# Patient Record
Sex: Female | Born: 1995 | Race: White | Hispanic: No | Marital: Single | Smoking: Never smoker
Health system: Southern US, Community
[De-identification: ages and names within clinical notes are randomized; demographics above are authoritative.]

## PROBLEM LIST (undated history)

## (undated) DIAGNOSIS — F32A Depression, unspecified: Secondary | ICD-10-CM

## (undated) DIAGNOSIS — F419 Anxiety disorder, unspecified: Secondary | ICD-10-CM

## (undated) HISTORY — PX: WISDOM TOOTH EXTRACTION: SHX21

---

## 2016-09-04 ENCOUNTER — Encounter (HOSPITAL_COMMUNITY): Payer: Self-pay

## 2016-09-04 DIAGNOSIS — Z79899 Other long term (current) drug therapy: Secondary | ICD-10-CM | POA: Diagnosis not present

## 2016-09-04 DIAGNOSIS — N939 Abnormal uterine and vaginal bleeding, unspecified: Secondary | ICD-10-CM | POA: Diagnosis not present

## 2016-09-04 NOTE — ED Triage Notes (Signed)
Pt states that she has been experiencing vaginal bleeding with clots since Thanksgiving. Endorses intermittent lower abdomen and lower back pain. Denies discharge or urinary symptoms. A&Ox4.

## 2016-09-05 ENCOUNTER — Emergency Department (HOSPITAL_COMMUNITY)
Admission: EM | Admit: 2016-09-05 | Discharge: 2016-09-05 | Disposition: A | Discharge status: Home / Self Care | Payer: BLUE CROSS/BLUE SHIELD | Attending: Emergency Medicine | Admitting: Emergency Medicine

## 2016-09-05 DIAGNOSIS — N939 Abnormal uterine and vaginal bleeding, unspecified: Secondary | ICD-10-CM

## 2016-09-05 LAB — URINALYSIS, ROUTINE W REFLEX MICROSCOPIC
Bilirubin Urine: NEGATIVE
GLUCOSE, UA: NEGATIVE mg/dL
Ketones, ur: NEGATIVE mg/dL
Nitrite: NEGATIVE
PH: 5 (ref 5.0–8.0)
Protein, ur: 100 mg/dL — AB
SPECIFIC GRAVITY, URINE: 1.032 — AB (ref 1.005–1.030)

## 2016-09-05 LAB — CBC
HCT: 29.7 % — ABNORMAL LOW (ref 36.0–46.0)
Hemoglobin: 9.5 g/dL — ABNORMAL LOW (ref 12.0–15.0)
MCH: 25 pg — ABNORMAL LOW (ref 26.0–34.0)
MCHC: 32 g/dL (ref 30.0–36.0)
MCV: 78.2 fL (ref 78.0–100.0)
Platelets: 424 K/uL — ABNORMAL HIGH (ref 150–400)
RBC: 3.8 MIL/uL — ABNORMAL LOW (ref 3.87–5.11)
RDW: 14.6 % (ref 11.5–15.5)
WBC: 10 K/uL (ref 4.0–10.5)

## 2016-09-05 LAB — WET PREP, GENITAL
Clue Cells Wet Prep HPF POC: NONE SEEN
SPERM: NONE SEEN
Trich, Wet Prep: NONE SEEN
YEAST WET PREP: NONE SEEN

## 2016-09-05 LAB — I-STAT BETA HCG BLOOD, ED (MC, WL, AP ONLY): I-stat hCG, quantitative: <5 m[IU]/mL (ref <5)

## 2016-09-05 MED ORDER — NORETHINDRONE ACETATE 5 MG PO TABS: 5.0000 mg | ORAL_TABLET | Freq: Every day | ORAL | 0 refills | Status: AC

## 2016-09-05 NOTE — Discharge Instructions (Signed)
Please take medication daily as directed. This is to help slow down your vaginal bleeding.  You need to have OBGYN follow up! Please call the clinic listed or clinic of your choice to schedule a follow-up appointment first thing Monday morning. Please return to the ER if vaginal bleeding worsens, you become much more weak, new or worsening symptoms occur or you have any additional concerns.

## 2016-09-05 NOTE — ED Provider Notes (Signed)
[facility]-EMERGENCY DEPT Provider Note   CSN: [id number] Arrival date & time: 09/04/16  2221     History   Chief Complaint Chief Complaint  Patient presents with  . Vaginal Bleeding    HPI Holly Castillo is a 21 y.o. female.  The history is provided by the patient and medical records. No language interpreter was used.  Vaginal Bleeding  Primary symptoms include vaginal bleeding.  Primary symptoms include no dysuria. Associated symptoms include abdominal pain. Pertinent negatives include no constipation, no diarrhea, no nausea and no vomiting.   Holly Castillo is an otherwise healthy 21 y.o. female  who presents to the Emergency Department complaining of Persistent vaginal bleeding for the last 2 months. Patient states she will have certain days where the bleeding is lighter and others when the bleeding is heavy. She does endorse intermittent quarter size clots. She has been more tired than usual over the last week, but no dizziness or episodes of syncope. She states that she has had irregular menstrual cycles in the past, but never vaginal bleeding that lasted this long. She is not on birth control and has been off of any oral contraceptive pills for over a year now. She endorses bilateral lower abdominal cramping intermittently. She denies fevers, dysuria, urinary urgency/frequency, vaginal discharge, n/v/ or any other associated complaints.   History reviewed. No pertinent past medical history.  There are no active problems to display for this patient.   History reviewed. No pertinent surgical history.  OB History    No data available       Home Medications    Prior to Admission medications   Medication Sig Start Date End Date Taking? Authorizing Provider  norethindrone (AYGESTIN) 5 MG tablet Take 1 tablet (5 mg total) by mouth daily. 09/05/16   Chase Picket Austin Herd, PA-C    Family History History reviewed. No pertinent family history.  Social History Social History  Substance Use  Topics  . Smoking status: Never Smoker  . Smokeless tobacco: Never Used  . Alcohol use Not on file     Allergies   Patient has no known allergies.   Review of Systems Review of Systems  Constitutional: Negative for chills and fever.  HENT: Negative for congestion.   Eyes: Negative for visual disturbance.  Respiratory: Negative for cough and shortness of breath.   Cardiovascular: Negative.   Gastrointestinal: Positive for abdominal pain. Negative for blood in stool, constipation, diarrhea, nausea and vomiting.  Genitourinary: Positive for vaginal bleeding. Negative for dysuria.  Musculoskeletal: Negative for back pain and neck pain.  Skin: Negative for rash.  Neurological: Negative for headaches.     Physical Exam Updated Vital Signs BP 106/69 (BP Location: Left Arm)   Pulse 82   Temp 98 F (36.7 C) (Oral)   Resp 18   Ht 5' 2.5" (1.588 m)   Wt 108.9 kg   SpO2 100%   BMI 43.20 kg/m   Physical Exam  Constitutional: She is oriented to person, place, and time. She appears well-developed and well-nourished. No distress.  HENT:  Head: Normocephalic and atraumatic.  Cardiovascular: Normal rate, regular rhythm and normal heart sounds.   No murmur heard. Pulmonary/Chest: Effort normal and breath sounds normal. No respiratory distress.  Abdominal: Soft. Bowel sounds are normal. She exhibits no distension. There is tenderness.  Mild bilateral lower abdominal tenderness with no focal areas of tenderness. No rebound or guarding.  Genitourinary:  Genitourinary Comments: Chaperone present for exam. + vaginal bleeding. No vaginal discharge. No adnexal  masses, tenderness, or fullness. No CMT.  Neurological: She is alert and oriented to person, place, and time.  Skin: Skin is warm and dry.  Nursing note and vitals reviewed.    ED Treatments / Results  Labs (all labs ordered are listed, but only abnormal results are displayed) Labs Reviewed  WET PREP, GENITAL - Abnormal;  Notable for the following:       Result Value   WBC, Wet Prep HPF POC MODERATE (*)    All other components within normal limits  CBC - Abnormal; Notable for the following:    RBC 3.80 (*)    Hemoglobin 9.5 (*)    HCT 29.7 (*)    MCH 25.0 (*)    Platelets 424 (*)    All other components within normal limits  URINALYSIS, ROUTINE W REFLEX MICROSCOPIC - Abnormal; Notable for the following:    Color, Urine Elvera (*)    APPearance HAZY (*)    Specific Gravity, Urine 1.032 (*)    Hgb urine dipstick LARGE (*)    Protein, ur 100 (*)    Leukocytes, UA TRACE (*)    Bacteria, UA FEW (*)    Squamous Epithelial / LPF 6-30 (*)    All other components within normal limits  I-STAT BETA HCG BLOOD, ED ([facility], [facility], [facility] ONLY)  GC/CHLAMYDIA PROBE AMP ([place]) NOT AT [facility]    EKG  EKG Interpretation None       Radiology No results found.  Procedures Procedures (including critical care time)  Medications Ordered in ED Medications - No data to display   Initial Impression / Assessment and Plan / ED Course  I have reviewed the triage vital signs and the nursing notes.  Pertinent labs & imaging results that were available during my care of the patient were reviewed by me and considered in my medical decision making (see chart for details).  Clinical Course    [name] is a 21 y.o. female who presents to ED for persistent vaginal bleeding for the last 2 months associated with fatigue. Pregnancy test negative. H&H 9.5/29.7. To exam with bleeding in the vaginal vault, but otherwise reassuring with no cervical motion or adnexal tenderness. Wet prep shows moderate white cells, but otherwise negative. Will treat with short course of oral progestin and outpatient follow-up with OB/GYN. Referral information given. Home care instructions and follow-up care for discussed with the patient who agrees with plan. Return precautions discussed and all questions answered.  Patient discussed with Dr. Jodi MourningZavitz  who agrees with treatment plan.   Final Clinical Impressions(s) / ED Diagnoses   Final diagnoses:  Vaginal bleeding    New Prescriptions Discharge Medication List as of 09/05/2016  5:32 AM    START taking these medications   Details  norethindrone (AYGESTIN) 5 MG tablet Take 1 tablet (5 mg total) by mouth daily., Starting Sat 09/05/2016, Print         Costco WholesaleJaime Pilcher Anastazia Creek, PA-C 09/05/16 [id number]    Blane OharaJoshua Zavitz, MD 09/05/16 [phone]

## 2016-09-07 LAB — GC/CHLAMYDIA PROBE AMP (~~LOC~~) NOT AT ARMC
Chlamydia: POSITIVE — AB
Neisseria Gonorrhea: NEGATIVE

## 2016-09-08 ENCOUNTER — Encounter (HOSPITAL_COMMUNITY): Payer: Self-pay | Admitting: Emergency Medicine

## 2016-09-14 ENCOUNTER — Encounter (HOSPITAL_COMMUNITY): Payer: Self-pay | Admitting: Emergency Medicine

## 2016-09-14 DIAGNOSIS — R102 Pelvic and perineal pain: Secondary | ICD-10-CM | POA: Diagnosis present

## 2016-09-14 DIAGNOSIS — N938 Other specified abnormal uterine and vaginal bleeding: Secondary | ICD-10-CM | POA: Diagnosis not present

## 2016-09-14 DIAGNOSIS — Z79899 Other long term (current) drug therapy: Secondary | ICD-10-CM | POA: Diagnosis not present

## 2016-09-14 DIAGNOSIS — N730 Acute parametritis and pelvic cellulitis: Secondary | ICD-10-CM | POA: Diagnosis not present

## 2016-09-14 NOTE — ED Triage Notes (Signed)
Pt states she received a call from the health dept today telling her she had tested positive for chlamydia and needed to get treatment  Pt states she does not get off work in time to go there so she came here

## 2016-09-15 ENCOUNTER — Emergency Department (HOSPITAL_COMMUNITY)
Admission: EM | Admit: 2016-09-15 | Discharge: 2016-09-15 | Disposition: A | Discharge status: Home / Self Care | Payer: BLUE CROSS/BLUE SHIELD | Attending: Emergency Medicine | Admitting: Emergency Medicine

## 2016-09-15 DIAGNOSIS — N730 Acute parametritis and pelvic cellulitis: Secondary | ICD-10-CM

## 2016-09-15 DIAGNOSIS — N938 Other specified abnormal uterine and vaginal bleeding: Secondary | ICD-10-CM

## 2016-09-15 LAB — I-STAT CHEM 8, ED
BUN: 13 mg/dL (ref 6–20)
Calcium, Ion: 1.25 mmol/L (ref 1.15–1.40)
Chloride: 106 mmol/L (ref 101–111)
Creatinine, Ser: 0.6 mg/dL (ref 0.44–1.00)
Glucose, Bld: 104 mg/dL — ABNORMAL HIGH (ref 65–99)
HEMATOCRIT: 28 % — AB (ref 36.0–46.0)
HEMOGLOBIN: 9.5 g/dL — AB (ref 12.0–15.0)
Potassium: 3.6 mmol/L (ref 3.5–5.1)
SODIUM: 143 mmol/L (ref 135–145)
TCO2: 23 mmol/L (ref 0–100)

## 2016-09-15 MED ORDER — NORETHINDRONE ACETATE 5 MG PO TABS: 5.0000 mg | ORAL_TABLET | Freq: Every day | ORAL | 0 refills | Status: AC

## 2016-09-15 MED ORDER — DOXYCYCLINE HYCLATE 100 MG PO TABS
100.0000 mg | ORAL_TABLET | Freq: Once | ORAL | Status: AC
  Administered 2016-09-15: 100 mg via ORAL
  Filled 2016-09-15: qty 1

## 2016-09-15 MED ORDER — AZITHROMYCIN 250 MG PO TABS: 1000.0000 mg | ORAL_TABLET | Freq: Once | ORAL | Status: DC

## 2016-09-15 MED ORDER — CEFTRIAXONE SODIUM 250 MG IJ SOLR
250.0000 mg | Freq: Once | INTRAMUSCULAR | Status: AC
  Administered 2016-09-15: 250 mg via INTRAMUSCULAR
  Filled 2016-09-15: qty 250

## 2016-09-15 MED ORDER — DOXYCYCLINE HYCLATE 100 MG PO CAPS: 100.0000 mg | ORAL_CAPSULE | Freq: Two times a day (BID) | ORAL | 0 refills | Status: AC

## 2016-09-15 NOTE — ED Notes (Signed)
Patient states that she has been up for 24hrs and was notified by health Dept that she was positive for Chlamydia.  Patient is not showing any signs of distress.

## 2016-09-15 NOTE — ED Notes (Signed)
Patient did not receive the full quantity of her doxycyline-per Dr Christiane HaIssacs-OK to call in an additional 7 days worth of meds[facility] [phone]

## 2016-09-15 NOTE — ED Provider Notes (Signed)
[facility] DEPT Provider Note   CSN: [id number] Arrival date & time: 09/14/16  2111     History   Chief Complaint Chief Complaint  Patient presents with  . SEXUALLY TRANSMITTED DISEASE    HPI Holly Castillo is a 21 y.o. female who presents to be treated for STD and pelvic pain. No significant PMH. She states that she has been having menorrhagia for the past month. She was seen on Jan 13th and had a pelvic exam and was given birth control and advised to follow up with OBGYN. G&C swabs were taken as well and Chlamydia came back positive. The patient was notified recently and she has returned tonight for treatment. She states she has had ongoing lower pelvic pain as well for the past month. It is constant and has worsened since last being seen on the 13th. She denies fever, chills, upper abdominal pain, N/V/D, dysuria, vaginal discharge. She also notes that her bleeding got better with the birth control and when it ran out the bleeding returned. She has not made appt with OBGYN yet.  HPI  History reviewed. No pertinent past medical history.  There are no active problems to display for this patient.   History reviewed. No pertinent surgical history.  OB History    No data available       Home Medications    Prior to Admission medications   Medication Sig Start Date End Date Taking? Authorizing Provider  doxycycline (VIBRAMYCIN) 100 MG capsule Take 1 capsule (100 mg total) by mouth 2 (two) times daily. 09/15/16   Bethel BornKelly Marie Gekas, PA-C  norethindrone (AYGESTIN) 5 MG tablet Take 1 tablet (5 mg total) by mouth daily. 09/05/16   Chase PicketJaime Pilcher Ward, PA-C  norethindrone (AYGESTIN) 5 MG tablet Take 1 tablet (5 mg total) by mouth daily. 09/15/16   Bethel BornKelly Marie Gekas, PA-C    Family History Family History  Problem Relation Age of Onset  . Hypertension Mother   . Epilepsy Mother   . CAD Father   . Heart attack Father     Social History Social History  Substance Use Topics  .  Smoking status: Never Smoker  . Smokeless tobacco: Never Used  . Alcohol use No     Allergies   Patient has no known allergies.   Review of Systems Review of Systems  Constitutional: Negative for chills and fever.  Gastrointestinal: Negative for abdominal pain, diarrhea, nausea and vomiting.  Genitourinary: Positive for menstrual problem, pelvic pain and vaginal bleeding. Negative for dysuria, flank pain and vaginal discharge.  All other systems reviewed and are negative.    Physical Exam Updated Vital Signs BP 124/80 (BP Location: Right Arm)   Pulse 77   Temp 97.9 F (36.6 C) (Oral)   Resp 19   SpO2 100%   Physical Exam  Constitutional: She is oriented to person, place, and time. She appears well-developed and well-nourished. No distress.  HENT:  Head: Normocephalic and atraumatic.  Eyes: Conjunctivae are normal. Pupils are equal, round, and reactive to light. Right eye exhibits no discharge. Left eye exhibits no discharge. No scleral icterus.  Neck: Normal range of motion.  Cardiovascular: Normal rate and regular rhythm.  Exam reveals no gallop and no friction rub.   No murmur heard. Pulmonary/Chest: Effort normal and breath sounds normal. No respiratory distress. She has no wheezes. She has no rales. She exhibits no tenderness.  Abdominal: Soft. Bowel sounds are normal. She exhibits no distension and no mass. There is tenderness. There is  no rebound and no guarding. No hernia.  Bilateral moderate pelvic tenderness in RLQ, suprapubic, and LLQ   Neurological: She is alert and oriented to person, place, and time.  Skin: Skin is warm and dry.  Psychiatric: She has a normal mood and affect. Her behavior is normal.  Nursing note and vitals reviewed.    ED Treatments / Results  Labs (all labs ordered are listed, but only abnormal results are displayed) Labs Reviewed  I-STAT CHEM 8, ED - Abnormal; Notable for the following:       Result Value   Glucose, Bld 104 (*)     Hemoglobin 9.5 (*)    HCT 28.0 (*)    All other components within normal limits    EKG  EKG Interpretation None       Radiology No results found.  Procedures Procedures (including critical care time)  Medications Ordered in ED Medications  cefTRIAXone (ROCEPHIN) injection 250 mg (250 mg Intramuscular Given 09/15/16 0417)  doxycycline (VIBRA-TABS) tablet 100 mg (100 mg Oral Given 09/15/16 0417)     Initial Impression / Assessment and Plan / ED Course  I have reviewed the triage vital signs and the nursing notes.  Pertinent labs & imaging results that were available during my care of the patient were reviewed by me and considered in my medical decision making (see chart for details).  21 year old female presents with positive Chlamydia test and worsening pelvic pain. Patient is afebrile, not tachycardic or tachypneic, normotensive, and not hypoxic. I stat Chem 8 remarkable for anemia which is similar to previous levels. Will tx for PID in the setting of worsening pelvic pain and positive STD test. Rocephin and dose of Doxy given in ED. Doxy rx given as well. Refilled BC pills and advised follow up with OBGYN. Pt verbalized understanding. Patient is NAD, non-toxic, with stable VS. Patient is informed of clinical course, understands medical decision making process, and agrees with plan. Opportunity for questions provided and all questions answered. Return precautions given.   Final Clinical Impressions(s) / ED Diagnoses   Final diagnoses:  PID (acute pelvic inflammatory disease)  DUB (dysfunctional uterine bleeding)    New Prescriptions Discharge Medication List as of 09/15/2016  3:16 PM    START taking these medications   Details  doxycycline (VIBRAMYCIN) 100 MG capsule Take 1 capsule (100 mg total) by mouth 2 (two) times daily., Starting Tue 09/15/2016, Print    !! norethindrone (AYGESTIN) 5 MG tablet Take 1 tablet (5 mg total) by mouth daily., Starting Tue 09/15/2016, Print       !! - Potential duplicate medications found. Please discuss with provider.       Bethel Born, PA-C 09/15/16 2045    Tomasita Crumble, MD 09/17/16 [phone]

## 2016-09-15 NOTE — Discharge Instructions (Signed)
Please take antibiotics for the next two weeks Follow up with OBGYN

## 2016-09-15 NOTE — ED Notes (Signed)
Patient is alert and oriented x3.  She was given DC instructions and follow up visit instructions.  Patient gave verbal understanding. She was DC ambulatory under her own power to home.  V/S stable.  He was not showing any signs of distress on DC 

## 2016-09-17 ENCOUNTER — Emergency Department (HOSPITAL_COMMUNITY)
Admission: EM | Admit: 2016-09-17 | Discharge: 2016-09-17 | Disposition: A | Discharge status: Home / Self Care | Payer: BLUE CROSS/BLUE SHIELD | Attending: Emergency Medicine | Admitting: Emergency Medicine

## 2016-09-17 ENCOUNTER — Encounter (HOSPITAL_COMMUNITY): Payer: Self-pay | Admitting: Emergency Medicine

## 2016-09-17 DIAGNOSIS — R102 Pelvic and perineal pain: Secondary | ICD-10-CM | POA: Diagnosis not present

## 2016-09-17 DIAGNOSIS — Z79899 Other long term (current) drug therapy: Secondary | ICD-10-CM | POA: Insufficient documentation

## 2016-09-17 DIAGNOSIS — R112 Nausea with vomiting, unspecified: Secondary | ICD-10-CM | POA: Insufficient documentation

## 2016-09-17 LAB — CBC
HEMATOCRIT: 32.7 % — AB (ref 36.0–46.0)
Hemoglobin: 10.2 g/dL — ABNORMAL LOW (ref 12.0–15.0)
MCH: 24.3 pg — ABNORMAL LOW (ref 26.0–34.0)
MCHC: 31.2 g/dL (ref 30.0–36.0)
MCV: 78 fL (ref 78.0–100.0)
PLATELETS: 473 10*3/uL — AB (ref 150–400)
RBC: 4.19 MIL/uL (ref 3.87–5.11)
RDW: 15.1 % (ref 11.5–15.5)
WBC: 9 10*3/uL (ref 4.0–10.5)

## 2016-09-17 LAB — COMPREHENSIVE METABOLIC PANEL
ALT: 19 U/L (ref 14–54)
AST: 23 U/L (ref 15–41)
Albumin: 4.8 g/dL (ref 3.5–5.0)
Alkaline Phosphatase: 70 U/L (ref 38–126)
Anion gap: 10 (ref 5–15)
BILIRUBIN TOTAL: 0.4 mg/dL (ref 0.3–1.2)
BUN: 15 mg/dL (ref 6–20)
CO2: 20 mmol/L — ABNORMAL LOW (ref 22–32)
CREATININE: 0.75 mg/dL (ref 0.44–1.00)
Calcium: 9.3 mg/dL (ref 8.9–10.3)
Chloride: 108 mmol/L (ref 101–111)
Glucose, Bld: 101 mg/dL — ABNORMAL HIGH (ref 65–99)
POTASSIUM: 3.4 mmol/L — AB (ref 3.5–5.1)
Sodium: 138 mmol/L (ref 135–145)
TOTAL PROTEIN: 8.4 g/dL — AB (ref 6.5–8.1)

## 2016-09-17 LAB — URINALYSIS, ROUTINE W REFLEX MICROSCOPIC
BILIRUBIN URINE: NEGATIVE
Glucose, UA: NEGATIVE mg/dL
KETONES UR: NEGATIVE mg/dL
LEUKOCYTES UA: NEGATIVE
NITRITE: NEGATIVE
PH: 5 (ref 5.0–8.0)
Protein, ur: NEGATIVE mg/dL
SPECIFIC GRAVITY, URINE: 1.029 (ref 1.005–1.030)

## 2016-09-17 LAB — LIPASE, BLOOD: LIPASE: 20 U/L (ref 11–51)

## 2016-09-17 MED ORDER — NAPROXEN 500 MG PO TABS: 500.0000 mg | ORAL_TABLET | Freq: Two times a day (BID) | ORAL | 0 refills | Status: AC | PRN

## 2016-09-17 MED ORDER — AZITHROMYCIN 1 G PO PACK: 1.0000 g | PACK | Freq: Once | ORAL | Status: DC

## 2016-09-17 MED ORDER — MECLIZINE HCL 25 MG PO TABS: 25.0000 mg | ORAL_TABLET | Freq: Three times a day (TID) | ORAL | 0 refills | Status: AC | PRN

## 2016-09-17 MED ORDER — NAPROXEN 500 MG PO TABS
500.0000 mg | ORAL_TABLET | Freq: Once | ORAL | Status: AC
  Administered 2016-09-17: 500 mg via ORAL
  Filled 2016-09-17: qty 1

## 2016-09-17 MED ORDER — FLORANEX PO PACK: 1.0000 g | PACK | Freq: Two times a day (BID) | ORAL | 0 refills | Status: AC

## 2016-09-17 MED ORDER — ONDANSETRON HCL 4 MG PO TABS
4.0000 mg | ORAL_TABLET | Freq: Once | ORAL | Status: AC
  Administered 2016-09-17: 4 mg via ORAL
  Filled 2016-09-17: qty 1

## 2016-09-17 MED ORDER — METRONIDAZOLE 500 MG PO TABS: 500.0000 mg | ORAL_TABLET | Freq: Two times a day (BID) | ORAL | 0 refills | Status: AC

## 2016-09-17 MED ORDER — AZITHROMYCIN 250 MG PO TABS
1000.0000 mg | ORAL_TABLET | Freq: Once | ORAL | Status: AC
  Administered 2016-09-17: 1000 mg via ORAL
  Filled 2016-09-17: qty 4

## 2016-09-17 NOTE — ED Triage Notes (Addendum)
Pt reports she began to have emesis this am. Also had bilateral lower abd pain last night. Small amount of diarrhea this am. Is on abx at home for PID. Was told to return if she began to have emesis

## 2016-09-17 NOTE — Discharge Instructions (Signed)
°  Take your antibiotics as directed and to completion. You should never have any leftover antibiotics! Push fluids and stay well hydrated.   Any antibiotic use can reduce the efficacy of hormonal birth control. Please use back up method of contraception.   Do NOT drink alcohol with these medications.

## 2016-09-17 NOTE — ED Notes (Signed)
Patient d/c'd self care.  F/U and medications reviewed.  Patient verbalized understanding. 

## 2016-09-17 NOTE — ED Provider Notes (Signed)
[facility] DEPT Provider Note   CSN: [id number] Arrival date & time: 09/17/16  [id number]     History   Chief Complaint Chief Complaint  Patient presents with  . Emesis    HPI Holly Castillo is a 21 y.o. female who presents with cc of abdominal pain and vomiting. Patient was seen on 09/05/2016 for vaginal bleeding. She had a workup done at that time is without tenderness, but had a positive chlamydia test. Patient was then seen 2 days ago and given her worsening pelvic pain. The patient was treated empirically for PID with doxycycline and a dose of Rocephin at her last visit. Patient states that she has had nausea since that time and had one episode of vomiting this morning. She did vomit her medications. The patient states that by the end of the day. She has 8-10 out of 10 pelvic pain that is relieved with 2 Tylenol. 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 She states that her bleeding has improved since that time. She complains that the pain is bilateral. She has had some loose stool without diarrhea. She denies fevers.  HPI  History reviewed. No pertinent past medical history.  There are no active problems to display for this patient.   History reviewed. No pertinent surgical history.  OB History    No data available       Home Medications    Prior to Admission medications   Medication Sig Start Date End Date Taking? Authorizing Provider  doxycycline (VIBRAMYCIN) 100 MG capsule Take 1 capsule (100 mg total) by mouth 2 (two) times daily. 09/15/16   Bethel BornKelly Marie Gekas, PA-C  norethindrone (AYGESTIN) 5 MG tablet Take 1 tablet (5 mg total) by mouth daily. 09/05/16   Chase PicketJaime Pilcher Ward, PA-C  norethindrone (AYGESTIN) 5 MG tablet Take 1 tablet (5 mg total) by mouth daily. 09/15/16   Bethel BornKelly Marie Gekas, PA-C    Family History Family History  Problem Relation Age of Onset  . Hypertension Mother   . Epilepsy Mother    . CAD Father   . Heart attack Father     Social History Social History  Substance Use Topics  . Smoking status: Never Smoker  . Smokeless tobacco: Never Used  . Alcohol use No     Allergies   Patient has no known allergies.   Review of Systems Review of Systems Ten systems reviewed and are negative for acute change, except as noted in the HPI.   Physical Exam Updated Vital Signs BP 134/68 (BP Location: Right Arm)   Pulse 88   Temp 98.6 F (37 C) (Oral)   Resp 20   SpO2 100%   Physical Exam  Constitutional: She is oriented to person, place, and time. She appears well-developed and well-nourished. No distress.  HENT:  Head: Normocephalic and atraumatic.  Eyes: Conjunctivae are normal. No scleral icterus.  Neck: Normal range of motion.  Cardiovascular: Normal rate, regular rhythm and normal heart sounds.  Exam reveals no gallop and no friction rub.   No murmur heard. Pulmonary/Chest: Effort normal and breath sounds normal. No respiratory distress.  Abdominal: Soft. Bowel sounds are normal. She exhibits no distension and no mass. There is tenderness. There is no guarding.  BL TTP pelvis and suprapubic region   Neurological: She is alert and oriented to person, place, and time.  Skin: Skin is warm and dry. She is not diaphoretic.  Nursing note and vitals reviewed.    ED Treatments / Results  Labs (all labs ordered are listed, but only abnormal results are displayed) Labs Reviewed  COMPREHENSIVE METABOLIC PANEL - Abnormal; Notable for the following:       Result Value   Potassium 3.4 (*)    CO2 20 (*)    Glucose, Bld 101 (*)    Total Protein 8.4 (*)    All other components within normal limits  CBC - Abnormal; Notable for the following:    Hemoglobin 10.2 (*)    HCT 32.7 (*)    MCH 24.3 (*)    Platelets 473 (*)    All other components within normal limits  LIPASE, BLOOD  URINALYSIS, ROUTINE W REFLEX MICROSCOPIC    EKG  EKG Interpretation None         Radiology No results found.  Procedures Procedures (including critical care time)  Medications Ordered in ED Medications - No data to display   Initial Impression / Assessment and Plan / ED Course  I have reviewed the triage vital signs and the nursing notes.  Pertinent labs & imaging results that were available during my care of the patient were reviewed by me and considered in my medical decision making (see chart for details).       patient with PID,  Bl pelvic pain does not favor TOA.  Patient pain markedly improved with naproxen  she will be discharged with meds listed below. Patient seen in shared visit with attending physician. Who agrees with assessment, work up , treatment, and plan for discharge.   Final Clinical Impressions(s) / ED Diagnoses   Final diagnoses:  Nausea and vomiting, intractability of vomiting not specified, unspecified vomiting type  Pelvic pain    New Prescriptions Discharge Medication List as of 09/17/2016  4:41 PM    START taking these medications   Details  lactobacillus (FLORANEX/LACTINEX) PACK Take 1 packet (1 g total) by mouth 2 (two) times daily., Starting Thu 09/17/2016, Print    meclizine (ANTIVERT) 25 MG tablet Take 1 tablet (25 mg total) by mouth 3 (three) times daily as needed for nausea., Starting Thu 09/17/2016, Print    metroNIDAZOLE (FLAGYL) 500 MG tablet Take 1 tablet (500 mg total) by mouth 2 (two) times daily., Starting Thu 09/17/2016, Print    naproxen (NAPROSYN) 500 MG tablet Take 1 tablet (500 mg total) by mouth 2 (two) times daily as needed for moderate pain., Starting Thu 09/17/2016, Print         Arthor Captain, PA-C 09/17/16 2051    Arby Barrette, MD 09/21/16 [phone]

## 2016-09-17 NOTE — ED Notes (Addendum)
Pt reports vomiting about 30 mins after taking doxycycline this am.

## 2020-11-04 ENCOUNTER — Emergency Department (HOSPITAL_COMMUNITY)
Admission: EM | Admit: 2020-11-04 | Discharge: 2020-11-04 | Disposition: A | Discharge status: Home / Self Care | Payer: Self-pay | Attending: Emergency Medicine | Admitting: Emergency Medicine

## 2020-11-04 ENCOUNTER — Emergency Department (HOSPITAL_COMMUNITY): Payer: Self-pay

## 2020-11-04 ENCOUNTER — Encounter (HOSPITAL_COMMUNITY): Payer: Self-pay

## 2020-11-04 ENCOUNTER — Other Ambulatory Visit: Payer: Self-pay

## 2020-11-04 DIAGNOSIS — R197 Diarrhea, unspecified: Secondary | ICD-10-CM | POA: Insufficient documentation

## 2020-11-04 DIAGNOSIS — R112 Nausea with vomiting, unspecified: Secondary | ICD-10-CM | POA: Insufficient documentation

## 2020-11-04 DIAGNOSIS — R1010 Upper abdominal pain, unspecified: Secondary | ICD-10-CM | POA: Insufficient documentation

## 2020-11-04 HISTORY — DX: Anxiety disorder, unspecified: F41.9

## 2020-11-04 HISTORY — DX: Depression, unspecified: F32.A

## 2020-11-04 LAB — COMPREHENSIVE METABOLIC PANEL
ALT: 16 U/L (ref 0–44)
AST: 15 U/L (ref 15–41)
Albumin: 4.4 g/dL (ref 3.5–5.0)
Alkaline Phosphatase: 41 U/L (ref 38–126)
Anion gap: 10 (ref 5–15)
BUN: 11 mg/dL (ref 6–20)
CO2: 22 mmol/L (ref 22–32)
Calcium: 9.3 mg/dL (ref 8.9–10.3)
Chloride: 106 mmol/L (ref 98–111)
Creatinine, Ser: 0.78 mg/dL (ref 0.44–1.00)
GFR, Estimated: >60 mL/min (ref >60)
Glucose, Bld: 111 mg/dL — ABNORMAL HIGH (ref 70–99)
Potassium: 3.2 mmol/L — ABNORMAL LOW (ref 3.5–5.1)
Sodium: 138 mmol/L (ref 135–145)
Total Bilirubin: 0.5 mg/dL (ref 0.3–1.2)
Total Protein: 7.7 g/dL (ref 6.5–8.1)

## 2020-11-04 LAB — CBC WITH DIFFERENTIAL/PLATELET
Abs Immature Granulocytes: 0.06 10*3/uL (ref 0.00–0.07)
Basophils Absolute: 0.1 10*3/uL (ref 0.0–0.1)
Basophils Relative: 1 %
Eosinophils Absolute: 0 10*3/uL (ref 0.0–0.5)
Eosinophils Relative: 0 %
HCT: 42.9 % (ref 36.0–46.0)
Hemoglobin: 15.1 g/dL — ABNORMAL HIGH (ref 12.0–15.0)
Immature Granulocytes: 1 %
Lymphocytes Relative: 23 %
Lymphs Abs: 1.9 10*3/uL (ref 0.7–4.0)
MCH: 32.8 pg (ref 26.0–34.0)
MCHC: 35.2 g/dL (ref 30.0–36.0)
MCV: 93.1 fL (ref 80.0–100.0)
Monocytes Absolute: 0.6 10*3/uL (ref 0.1–1.0)
Monocytes Relative: 7 %
Neutro Abs: 5.5 10*3/uL (ref 1.7–7.7)
Neutrophils Relative %: 68 %
Platelets: 241 10*3/uL (ref 150–400)
RBC: 4.61 MIL/uL (ref 3.87–5.11)
RDW: 13.1 % (ref 11.5–15.5)
WBC: 8.2 10*3/uL (ref 4.0–10.5)
nRBC: 0 % (ref 0.0–0.2)

## 2020-11-04 LAB — URINALYSIS, ROUTINE W REFLEX MICROSCOPIC
Bilirubin Urine: NEGATIVE
Glucose, UA: NEGATIVE mg/dL
Hgb urine dipstick: NEGATIVE
Ketones, ur: NEGATIVE mg/dL
Leukocytes,Ua: NEGATIVE
Nitrite: NEGATIVE
Protein, ur: NEGATIVE mg/dL
Specific Gravity, Urine: 1.024 (ref 1.005–1.030)
pH: 5 (ref 5.0–8.0)

## 2020-11-04 LAB — RAPID URINE DRUG SCREEN, HOSP PERFORMED
Amphetamines: NOT DETECTED
Barbiturates: NOT DETECTED
Benzodiazepines: NOT DETECTED
Cocaine: NOT DETECTED
Opiates: POSITIVE — AB
Tetrahydrocannabinol: POSITIVE — AB

## 2020-11-04 LAB — LIPASE, BLOOD: Lipase: 25 U/L (ref 11–51)

## 2020-11-04 LAB — PREGNANCY, URINE: Preg Test, Ur: NEGATIVE

## 2020-11-04 MED ORDER — ONDANSETRON 4 MG PO TBDP: 4.0000 mg | ORAL_TABLET | Freq: Three times a day (TID) | ORAL | 2 refills | Status: AC | PRN

## 2020-11-04 MED ORDER — SODIUM CHLORIDE 0.9 % IV SOLN: INTRAVENOUS | Status: DC

## 2020-11-04 MED ORDER — IOHEXOL 300 MG/ML  SOLN
100.0000 mL | Freq: Once | INTRAMUSCULAR | Status: AC | PRN
  Administered 2020-11-04: 100 mL via INTRAVENOUS

## 2020-11-04 MED ORDER — SODIUM CHLORIDE 0.9 % IV BOLUS
1000.0000 mL | Freq: Once | INTRAVENOUS | Status: AC
  Administered 2020-11-04: 1000 mL via INTRAVENOUS

## 2020-11-04 MED ORDER — ONDANSETRON HCL 4 MG/2ML IJ SOLN
4.0000 mg | Freq: Once | INTRAMUSCULAR | Status: AC
  Administered 2020-11-04: 4 mg via INTRAVENOUS
  Filled 2020-11-04: qty 2

## 2020-11-04 NOTE — ED Triage Notes (Signed)
Pt presents to ED with complaints of emesis and diarrhea x couple of weeks. Pt seen at [facility] x 2 and was diagnosed with stomach bug couple weeks ago but states hasn't gotten any better.

## 2020-11-04 NOTE — Discharge Instructions (Signed)
Take the Zofran as directed new prescription provided.  Call and make an appointment to follow-up with GI medicine.  Today's labs and CAT scan without any significant findings.

## 2020-11-04 NOTE — ED Provider Notes (Signed)
[facility] EMERGENCY DEPARTMENT Provider Note   CSN: [id number] Arrival date & time: 11/04/20  2482     History Chief Complaint  Patient presents with  . Emesis    Holly Castillo is a 25 y.o. female.  Patient presenting with a complaint of nausea vomiting and diarrhea this been ongoing for about 3 weeks.  Patient seen at [facility] on February 21 and February 23 and felt to be a gastroenteritis at that time.  Did have a negative CT test and labs without significant abnormalities.  Covid testing was negative as well.  Patient states that continues to have vomiting about 5 times a day.  No blood in it.  And diarrhea no blood in that.  No fever here temp 98.1.  Heart rate 83 blood pressure 107/66.  Not associated with abdominal pain        Past Medical History:  Diagnosis Date  . Anxiety   . Depression     There are no problems to display for this patient.   Past Surgical History:  Procedure Laterality Date  . WISDOM TOOTH EXTRACTION       OB History   No obstetric history on file.     Family History  Problem Relation Age of Onset  . Hypertension Mother   . Epilepsy Mother   . CAD Father   . Heart attack Father     Social History   Tobacco Use  . Smoking status: Never Smoker  . Smokeless tobacco: Never Used  Vaping Use  . Vaping Use: Former  Substance Use Topics  . Alcohol use: No  . Drug use: Yes    Types: Marijuana    Home Medications Prior to Admission medications   Medication Sig Start Date End Date Taking? Authorizing Provider  bismuth subsalicylate (PEPTO BISMOL) 262 MG/15ML suspension Take 30 mLs by mouth every 6 (six) hours as needed for indigestion or diarrhea or loose stools.   Yes [provider]  Famotidine (PEPCID PO) Take 1 tablet by mouth daily as needed (indigestion/reflux).   Yes [provider]  ondansetron (ZOFRAN ODT) 4 MG disintegrating tablet Take 1 tablet (4 mg total) by mouth every 8 (eight) hours as  needed. 11/04/20  Yes Vanetta Mulders, MD  doxycycline (VIBRAMYCIN) 100 MG capsule Take 1 capsule (100 mg total) by mouth 2 (two) times daily. Patient not taking: Reported on 11/04/2020 09/15/16   Bethel Born, PA-C  lactobacillus (FLORANEX/LACTINEX) PACK Take 1 packet (1 g total) by mouth 2 (two) times daily. Patient not taking: No sig reported 09/17/16   Arthor Captain, PA-C  meclizine (ANTIVERT) 25 MG tablet Take 1 tablet (25 mg total) by mouth 3 (three) times daily as needed for nausea. Patient not taking: No sig reported 09/17/16   Arthor Captain, PA-C  metroNIDAZOLE (FLAGYL) 500 MG tablet Take 1 tablet (500 mg total) by mouth 2 (two) times daily. Patient not taking: No sig reported 09/17/16   Arthor Captain, PA-C  naproxen (NAPROSYN) 500 MG tablet Take 1 tablet (500 mg total) by mouth 2 (two) times daily as needed for moderate pain. Patient not taking: No sig reported 09/17/16   Arthor Captain, PA-C  norethindrone (AYGESTIN) 5 MG tablet Take 1 tablet (5 mg total) by mouth daily. Patient not taking: No sig reported 09/05/16   Ward, Chase Picket, PA-C  norethindrone (AYGESTIN) 5 MG tablet Take 1 tablet (5 mg total) by mouth daily. Patient not taking: Reported on 11/04/2020 09/15/16   Bethel Born,  PA-C    Allergies    Patient has no known allergies.  Review of Systems   Review of Systems  Constitutional: Negative for chills and fever.  HENT: Negative for rhinorrhea and sore throat.   Eyes: Negative for visual disturbance.  Respiratory: Negative for cough and shortness of breath.   Cardiovascular: Negative for chest pain and leg swelling.  Gastrointestinal: Positive for diarrhea, nausea and vomiting. Negative for abdominal pain.  Genitourinary: Negative for dysuria.  Musculoskeletal: Negative for back pain and neck pain.  Skin: Negative for rash.  Neurological: Negative for dizziness, light-headedness and headaches.  Hematological: Does not bruise/bleed easily.   Psychiatric/Behavioral: Negative for confusion.    Physical Exam Updated Vital Signs BP 111/80   Pulse (!) 52   Temp 98.1 F (36.7 C) (Oral)   Resp 18   Ht 1.575 m (5\' 2" )   Wt 96.2 kg   LMP 09/06/2020   SpO2 100%   BMI 38.78 kg/m   Physical Exam Vitals and nursing note reviewed.  Constitutional:      General: She is not in acute distress.    Appearance: Normal appearance. She is well-developed.  HENT:     Head: Normocephalic and atraumatic.  Eyes:     Extraocular Movements: Extraocular movements intact.     Conjunctiva/sclera: Conjunctivae normal.     Pupils: Pupils are equal, round, and reactive to light.  Cardiovascular:     Rate and Rhythm: Normal rate and regular rhythm.     Heart sounds: No murmur heard.   Pulmonary:     Effort: Pulmonary effort is normal. No respiratory distress.     Breath sounds: Normal breath sounds.  Abdominal:     Palpations: Abdomen is soft.     Tenderness: There is no abdominal tenderness.  Musculoskeletal:        General: Normal range of motion.     Cervical back: Normal range of motion and neck supple.  Skin:    General: Skin is warm and dry.  Neurological:     General: No focal deficit present.     Mental Status: She is alert and oriented to person, place, and time.     Cranial Nerves: No cranial nerve deficit.     Sensory: No sensory deficit.     Motor: No weakness.     ED Results / Procedures / Treatments   Labs (all labs ordered are listed, but only abnormal results are displayed) Labs Reviewed  CBC WITH DIFFERENTIAL/PLATELET - Abnormal; Notable for the following components:      Result Value   Hemoglobin 15.1 (*)    All other components within normal limits  COMPREHENSIVE METABOLIC PANEL - Abnormal; Notable for the following components:   Potassium 3.2 (*)    Glucose, Bld 111 (*)    All other components within normal limits  URINALYSIS, ROUTINE W REFLEX MICROSCOPIC - Abnormal; Notable for the following  components:   APPearance CLOUDY (*)    All other components within normal limits  RAPID URINE DRUG SCREEN, HOSP PERFORMED - Abnormal; Notable for the following components:   Opiates POSITIVE (*)    Tetrahydrocannabinol POSITIVE (*)    All other components within normal limits  LIPASE, BLOOD  PREGNANCY, URINE    EKG None  Radiology CT Abdomen Pelvis W Contrast  Result Date: 11/04/2020 CLINICAL DATA:  Nausea and vomiting, diarrhea. Upper abdominal and upper back pain. EXAM: CT ABDOMEN AND PELVIS WITH CONTRAST TECHNIQUE: Multidetector CT imaging of the abdomen and pelvis was  performed using the standard protocol following bolus administration of intravenous contrast. CONTRAST:  OMNIPAQUE IOHEXOL 300 MG/ML  SOLN COMPARISON:  10/14/2020. FINDINGS: Lower chest: Lung bases are clear. Heart is at the upper limits of normal in size. No pericardial or pleural effusion. Distal esophagus is unremarkable. Hepatobiliary: Liver and gallbladder are unremarkable. No biliary ductal dilatation. Pancreas: Negative. Spleen: Negative. Adrenals/Urinary Tract: Adrenal glands and kidneys are unremarkable. Ureters are decompressed. Bladder is grossly unremarkable. Stomach/Bowel: Stomach, small bowel, appendix and colon are unremarkable. Vascular/Lymphatic: Vascular structures are unremarkable. No pathologically enlarged lymph nodes. Reproductive: Uterus is visualized.  No adnexal mass. Other: No free fluid.  Mesenteries and peritoneum are unremarkable. Musculoskeletal: None. IMPRESSION: Normal exam.  No findings to explain the patient's clinical history. Electronically Signed   By: Leanna Battles M.D.   On: 11/04/2020 09:45    Procedures Procedures   Medications Ordered in ED Medications  0.9 %  sodium chloride infusion ( Intravenous New Bag/Given 11/04/20 0937)  sodium chloride 0.9 % bolus 1,000 mL (0 mLs Intravenous Stopped 11/04/20 0937)  ondansetron (ZOFRAN) injection 4 mg (4 mg Intravenous Given 11/04/20  0739)  iohexol (OMNIPAQUE) 300 MG/ML solution 100 mL (100 mLs Intravenous Contrast Given 11/04/20 0919)    ED Course  I have reviewed the triage vital signs and the nursing notes.  Pertinent labs & imaging results that were available during my care of the patient were reviewed by me and considered in my medical decision making (see chart for details).    MDM Rules/Calculators/A&P                         Work-up here repeat labs normal white blood cell count.  No significant anemia.  Complete metabolic panel showed mild hypokalemia at 3.2.  But no significant liver function test abnormalities.  Lipase was normal at 25.  Urinalysis it was cloudy but no signs of urinary tract infection.  Urine drug screen was positive for opiates and THC.  Possible that these could be playing a role.  But patient does not appear to be in any acute distress here certainly and showing no signs of withdrawal from narcotics.  And pregnancy test was negative.  Repeat CT scan which patient wanted to do had no acute findings.  Nothing to explain patient's symptoms.  Will refer patient to GI medicine for further evaluation.  Patient states that Zofran in the past has been helpful.  And she is out of that so we will provide a new prescription for that.  Final Clinical Impression(s) / ED Diagnoses Final diagnoses:  Nausea vomiting and diarrhea    Rx / DC Orders ED Discharge Orders         Ordered    ondansetron (ZOFRAN ODT) 4 MG disintegrating tablet  Every 8 hours PRN        11/04/20 1026           Vanetta Mulders, MD 11/04/20 1030

## 2022-03-10 IMAGING — CT CT ABD-PELV W/ CM
2 of 3 series · 17 of 46 positions shown, 19 images · IV contrast (omnipaque)
Comparison: 10/14/2020.

CLINICAL DATA: Nausea and vomiting, diarrhea. Upper abdominal and
upper back pain.

EXAM:
CT ABDOMEN AND PELVIS WITH CONTRAST
TECHNIQUE: Multidetector CT imaging of the abdomen and pelvis was performed
using the standard protocol following bolus administration of
intravenous contrast.
CONTRAST:  100mL OMNIPAQUE IOHEXOL 300 MG/ML  SOLN

[Series 2: axial st · axial · 0.71mm/px · z∈[+735,+1140]mm · 14 of 94 slices shown, 16 images]
[im 7/94  soft-tissue]
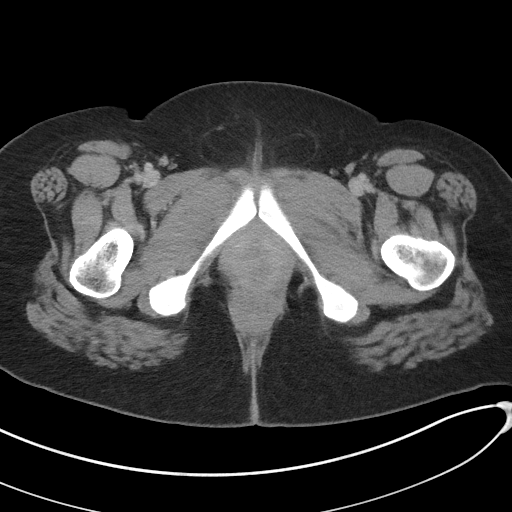
[im 7/94  bone]
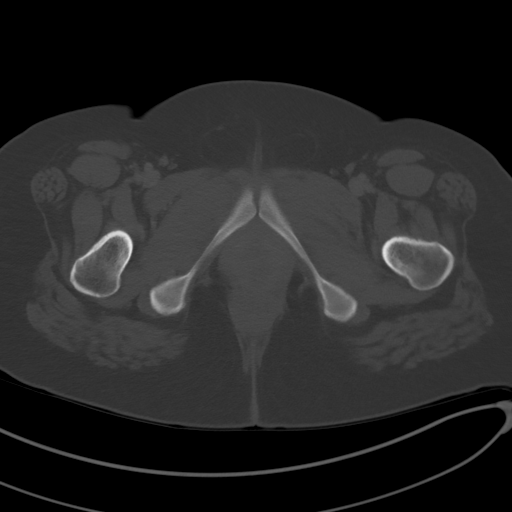
[im 13/94  soft-tissue]
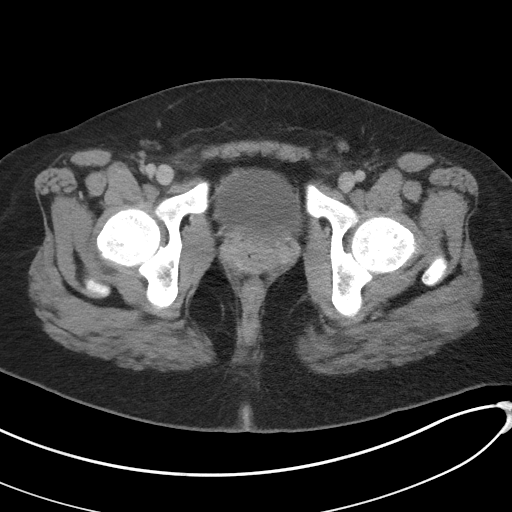
[im 19/94  soft-tissue]
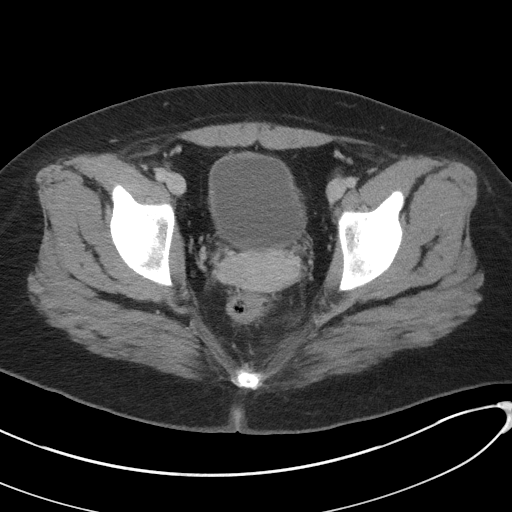
[im 25/94  soft-tissue]
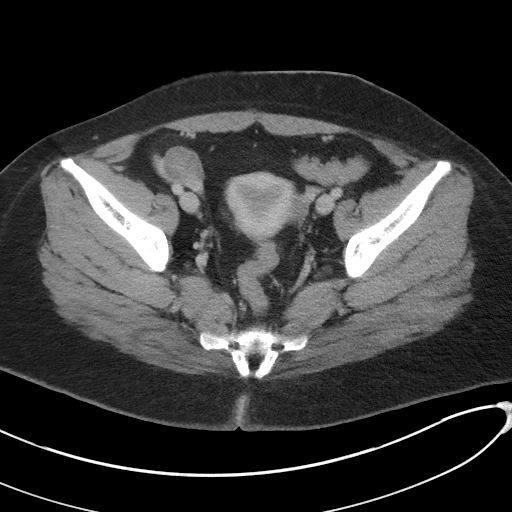
[im 31/94  soft-tissue]
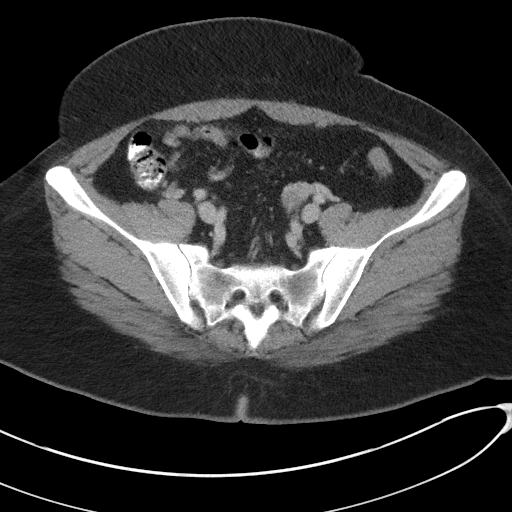
[im 37/94  soft-tissue]
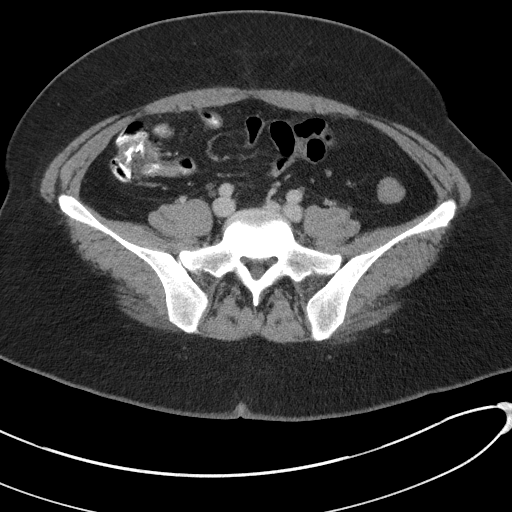
[im 43/94  soft-tissue]
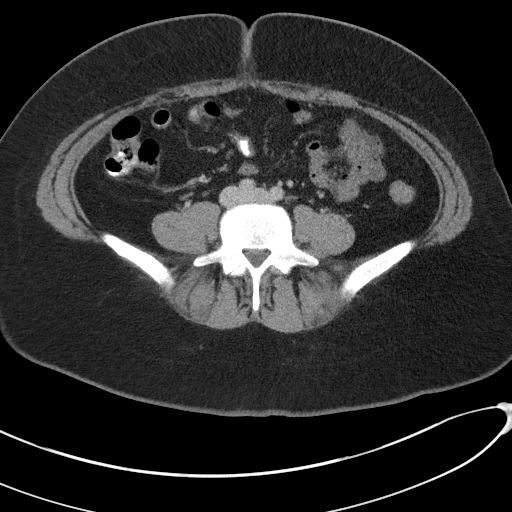
[im 52/94  soft-tissue]
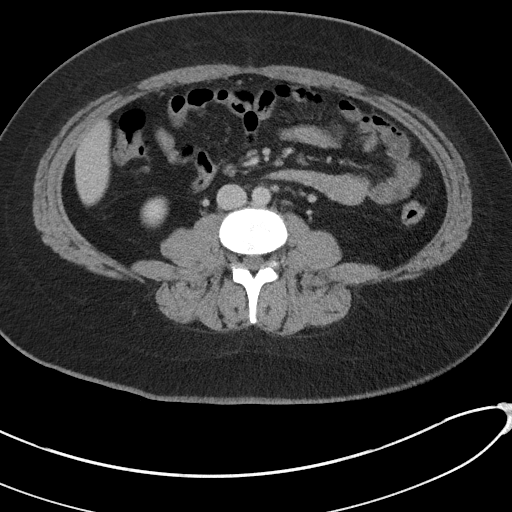
[im 58/94  soft-tissue]
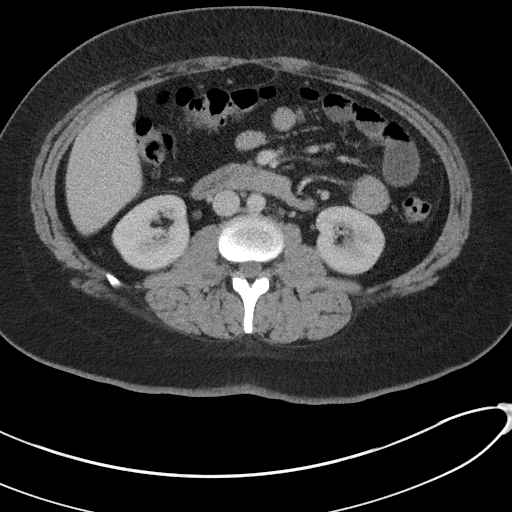
[im 58/94  bone]
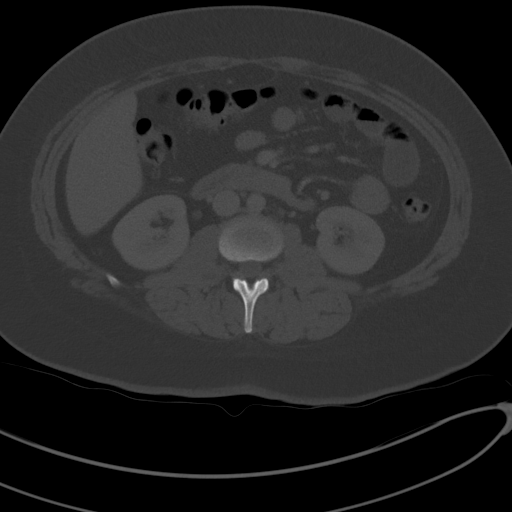
[im 64/94  soft-tissue]
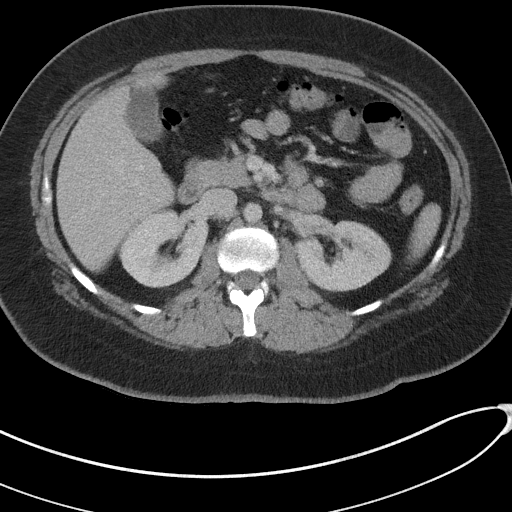
[im 70/94  soft-tissue]
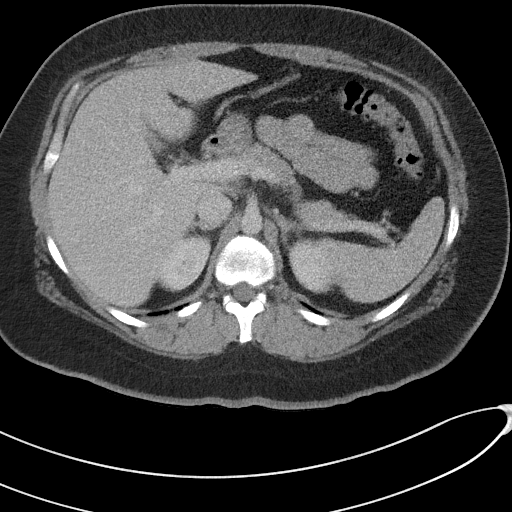
[im 76/94  soft-tissue]
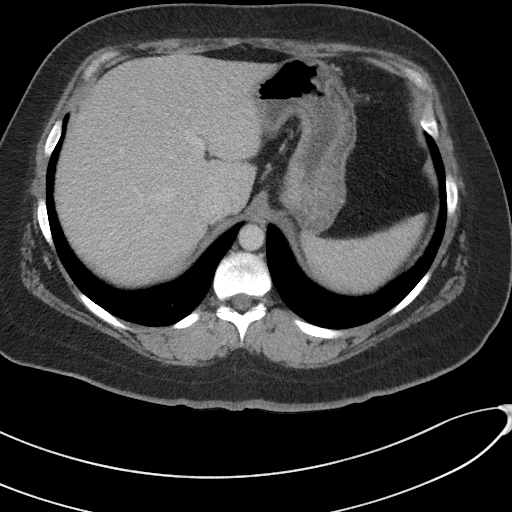
[im 82/94  soft-tissue]
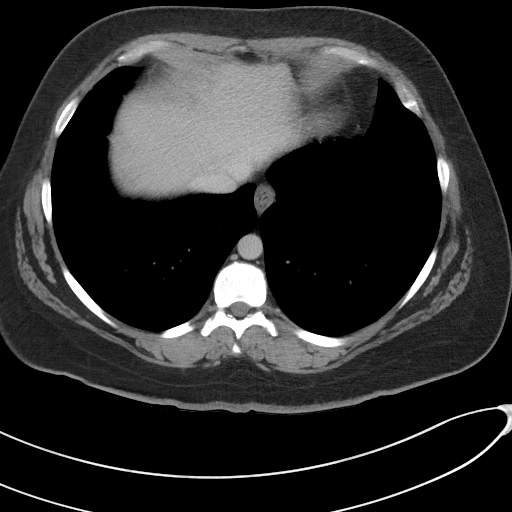
[im 88/94  soft-tissue]
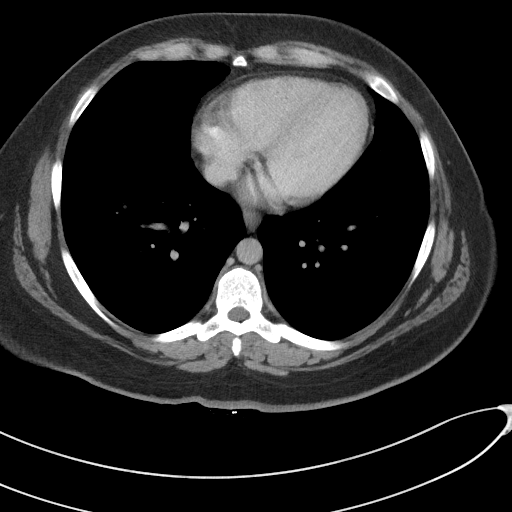

[Series 5: coronal st · coronal · 0.82mm/px · 3 of 117 slices shown]
[im 39/117  soft-tissue]
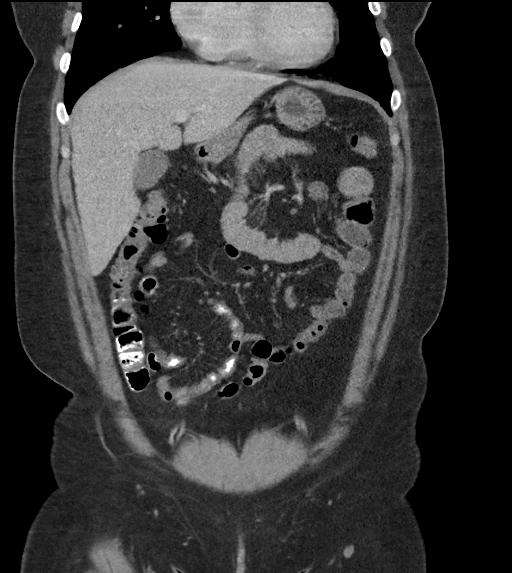
[im 52/117  soft-tissue]
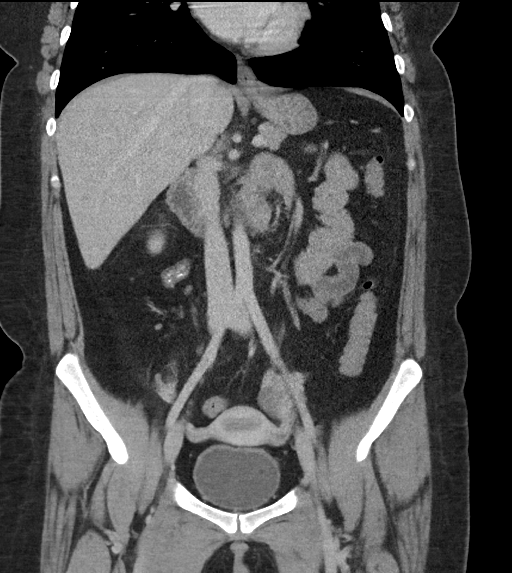
[im 65/117  soft-tissue]
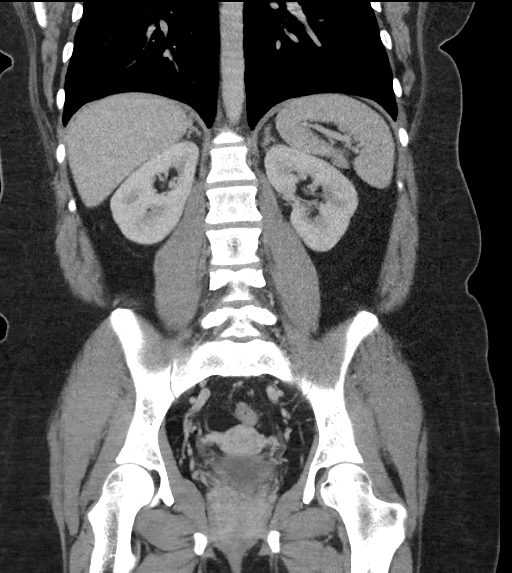

[17 of 46 positions shown; findings below may reference images not displayed]

FINDINGS: Lower chest: Lung bases are clear. Heart is at the upper limits of
normal in size. No pericardial or pleural effusion. Distal esophagus
is unremarkable.

Hepatobiliary: Liver and gallbladder are unremarkable. No biliary
ductal dilatation.

Pancreas: Negative.

Spleen: Negative.

Adrenals/Urinary Tract: Adrenal glands and kidneys are unremarkable.
Ureters are decompressed. Bladder is grossly unremarkable.

Stomach/Bowel: Stomach, small bowel, appendix and colon are
unremarkable.

Vascular/Lymphatic: Vascular structures are unremarkable. No
pathologically enlarged lymph nodes.

Reproductive: Uterus is visualized.  No adnexal mass.

Other: No free fluid.  Mesenteries and peritoneum are unremarkable.

Musculoskeletal: None.
IMPRESSION: Normal exam.  No findings to explain the patient's clinical history.
